# Patient Record
Sex: Male | Born: 1960 | Race: White | Hispanic: No | Marital: Single | Smoking: Former smoker
Health system: Southern US, Community
[De-identification: ages and names within clinical notes are randomized; demographics above are authoritative.]

## PROBLEM LIST (undated history)

## (undated) DIAGNOSIS — B1920 Unspecified viral hepatitis C without hepatic coma: Secondary | ICD-10-CM

---

## 2002-06-15 ENCOUNTER — Encounter: Payer: Self-pay | Admitting: *Deleted

## 2002-06-15 ENCOUNTER — Emergency Department (HOSPITAL_COMMUNITY): Admission: EM | Admit: 2002-06-15 | Discharge: 2002-06-15 | Payer: Self-pay | Admitting: *Deleted

## 2006-01-18 ENCOUNTER — Emergency Department: Payer: Self-pay | Admitting: Emergency Medicine

## 2006-01-19 ENCOUNTER — Emergency Department: Payer: Self-pay | Admitting: Emergency Medicine

## 2006-11-03 ENCOUNTER — Emergency Department: Payer: Self-pay | Admitting: Emergency Medicine

## 2009-01-28 ENCOUNTER — Observation Stay: Payer: Self-pay | Admitting: Internal Medicine

## 2009-01-30 ENCOUNTER — Inpatient Hospital Stay: Payer: Self-pay | Admitting: Psychiatry

## 2010-04-27 IMAGING — CT CT HEAD WITHOUT CONTRAST
1 series · 16 of 30 positions shown, 20 images · non-contrast
Comparison: none

REASON FOR EXAM: overdose altered mental status
COMMENTS:

PROCEDURE:     CT  - CT HEAD WITHOUT CONTRAST  - January 28, 2009  [DATE]
RESULT:     Comparison:  None
TECHNIQUE: Multiple axial images from the foramen magnum to the vertex were
obtained without IV contrast.

[Series 2: soft tissue · axial · 0.43mm/px · z∈[+880,+1040]mm · 16 of 36 slices shown, 20 images]
[im 2/36  brain]
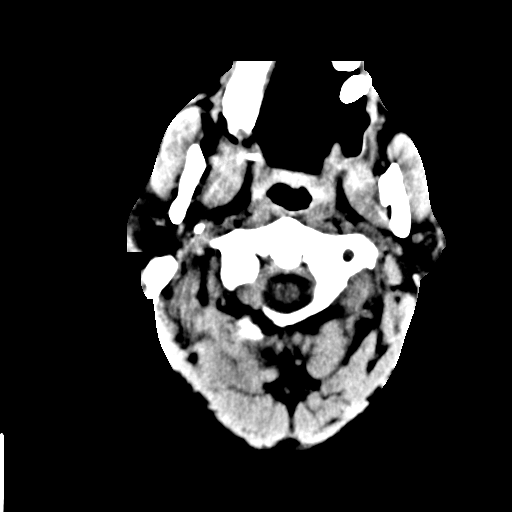
[im 2/36  bone]
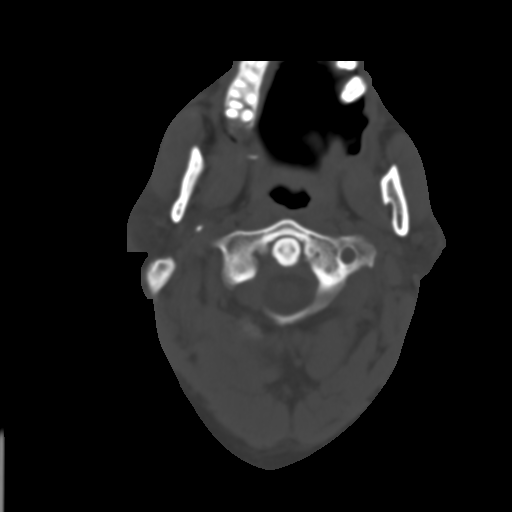
[im 4/36  brain]
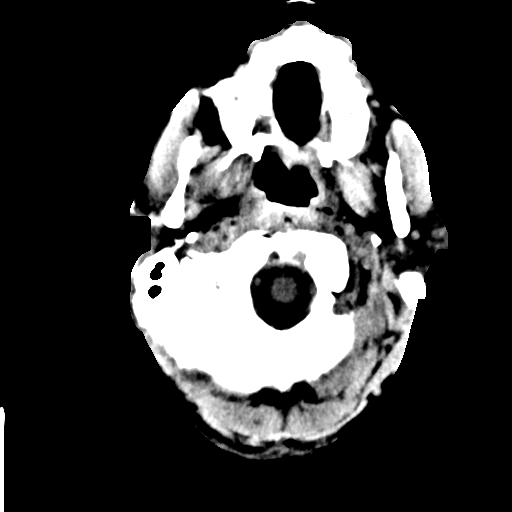
[im 7/36  brain]
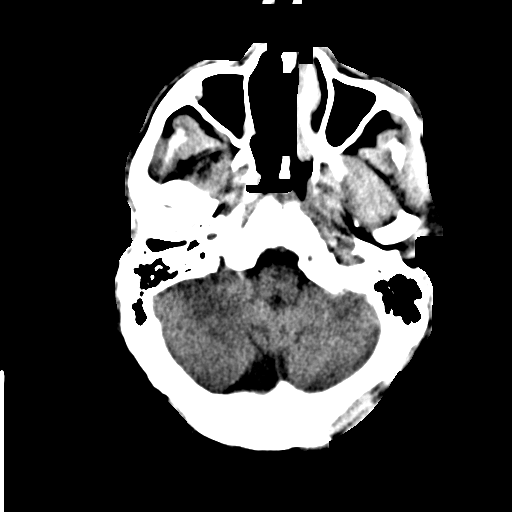
[im 9/36  brain]
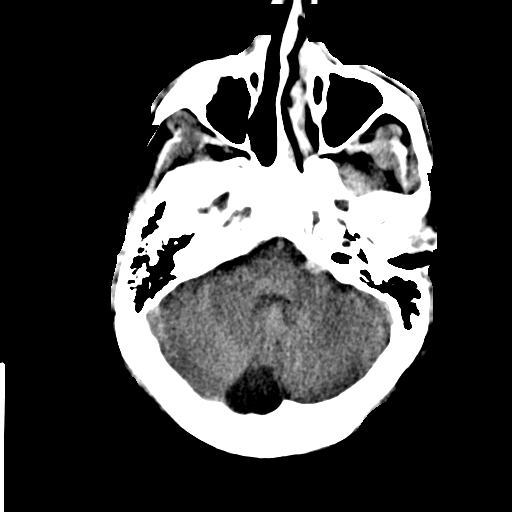
[im 10/36  brain]
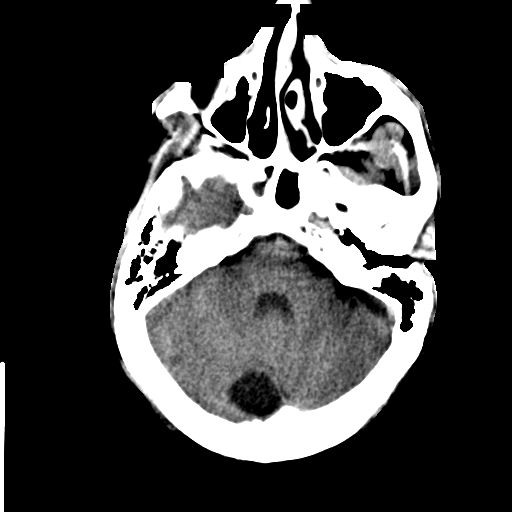
[im 10/36  bone]
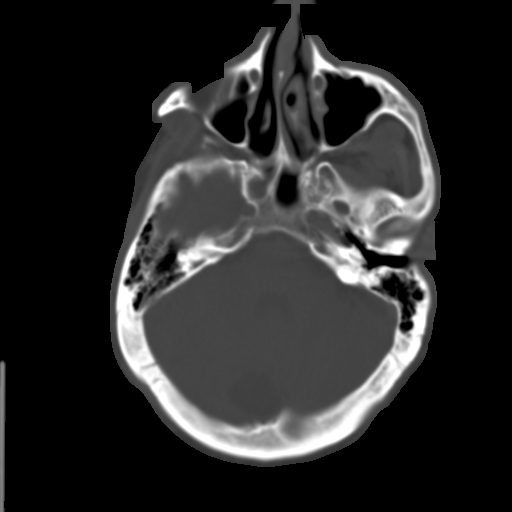
[im 13/36  brain]
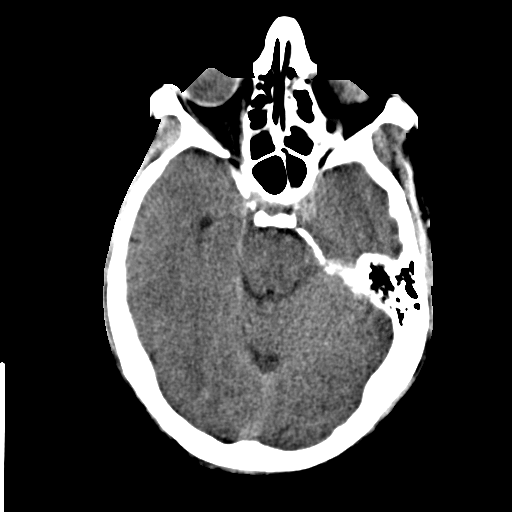
[im 15/36  brain]
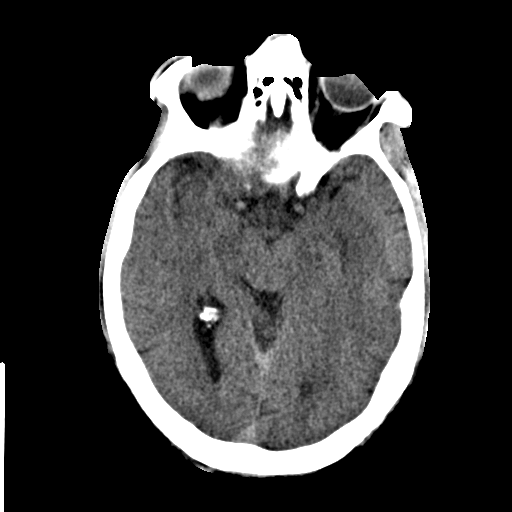
[im 17/36  brain]
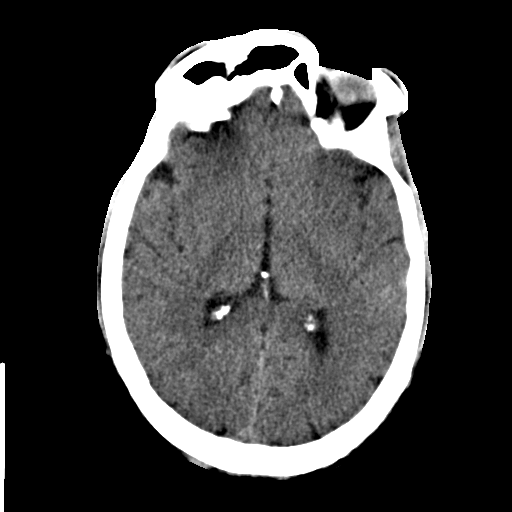
[im 19/36  brain]
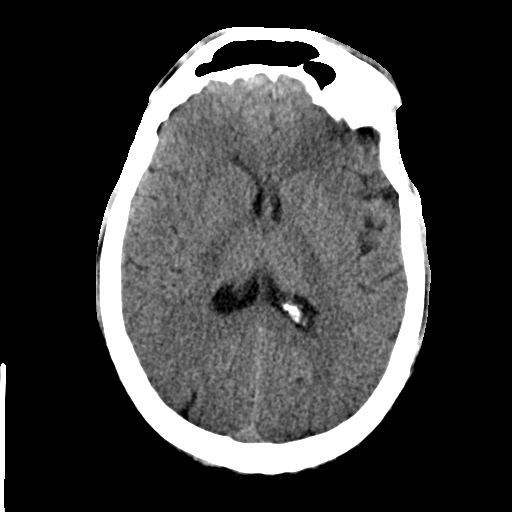
[im 19/36  bone]
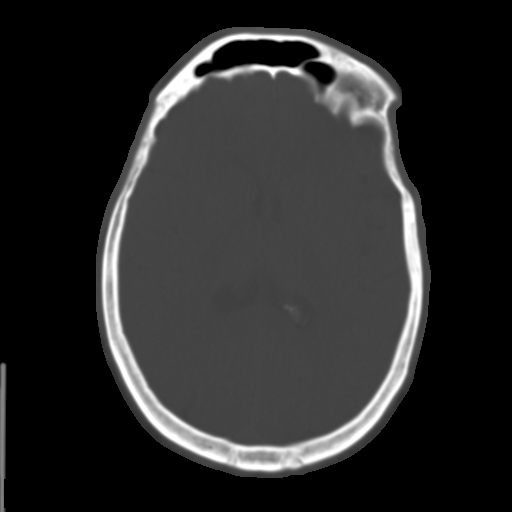
[im 21/36  brain]
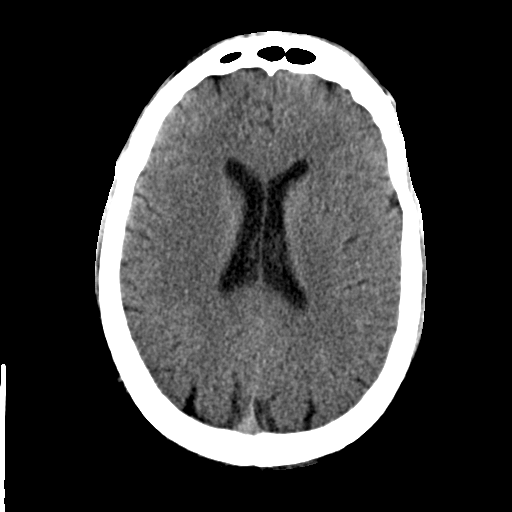
[im 23/36  brain]
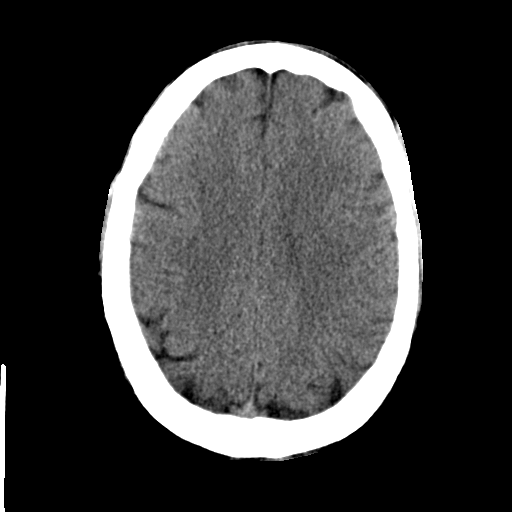
[im 26/36  brain]
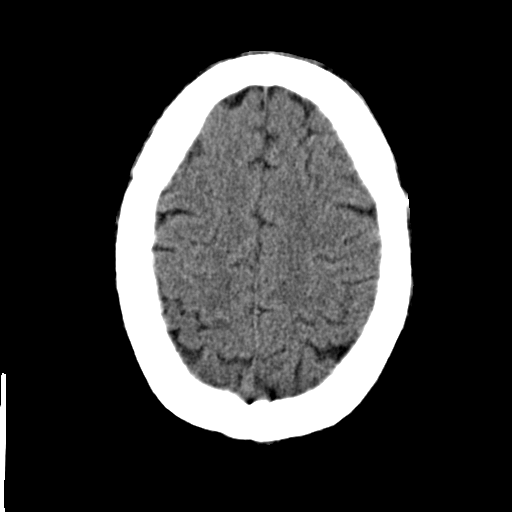
[im 27/36  brain]
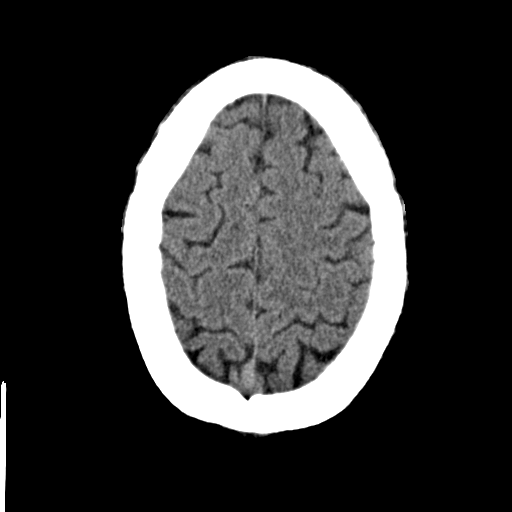
[im 27/36  bone]
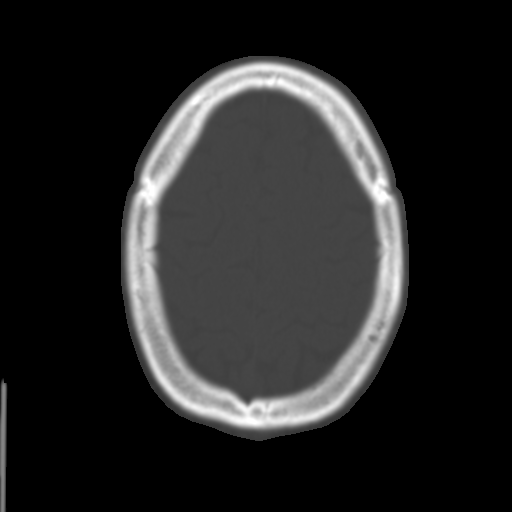
[im 29/36  brain]
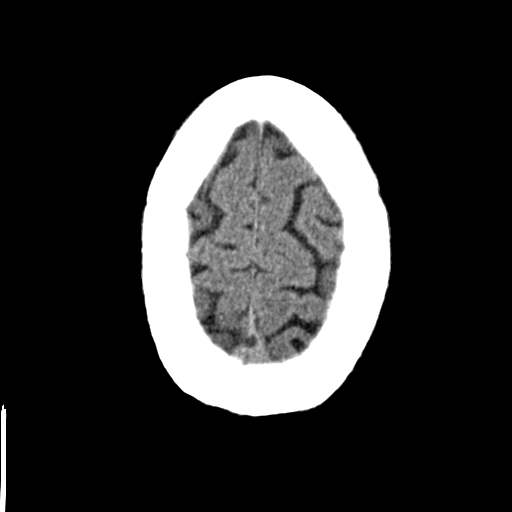
[im 32/36  brain]
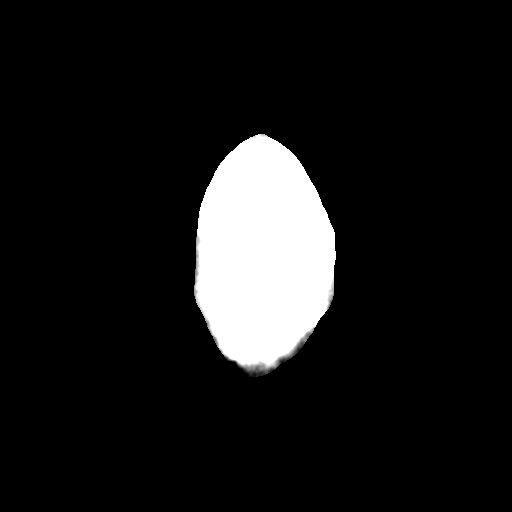
[im 34/36  brain]
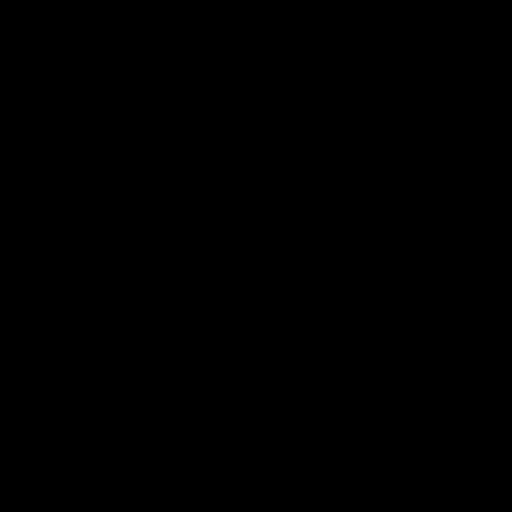

[16 of 30 positions shown; findings below may reference images not displayed]

FINDINGS: There is no evidence of mass effect, midline shift, or extra-axial fluid
collections.  There is no evidence of a space-occupying lesion or
intracranial hemorrhage. There is no evidence of a cortical-based area of
acute infarction.

The ventricles and sulci are appropriate for the patient's age. The basal
cisterns are patent.

Visualized portions of the orbits are unremarkable. There is mucosal
thickening involving the ethmoid sinuses an bilateral maxillary sinuses.

The osseous structures are unremarkable.
IMPRESSION: No acute intracranial process.

## 2014-04-11 ENCOUNTER — Emergency Department: Payer: Self-pay | Admitting: Student

## 2014-04-11 LAB — BASIC METABOLIC PANEL
Anion Gap: 3 — ABNORMAL LOW (ref 7–16)
BUN: 16 mg/dL (ref 7–18)
Calcium, Total: 8.3 mg/dL — ABNORMAL LOW (ref 8.5–10.1)
Chloride: 105 mmol/L (ref 98–107)
Co2: 31 mmol/L (ref 21–32)
Creatinine: 1.1 mg/dL (ref 0.60–1.30)
EGFR (African American): >60
EGFR (Non-African Amer.): >60
Glucose: 153 mg/dL — ABNORMAL HIGH (ref 65–99)
Osmolality: 282 (ref 275–301)
Potassium: 4 mmol/L (ref 3.5–5.1)
Sodium: 139 mmol/L (ref 136–145)

## 2014-04-11 LAB — CK: CK, Total: 234 U/L

## 2014-05-10 LAB — URINALYSIS, COMPLETE
BLOOD: NEGATIVE
Bacteria: NONE SEEN
Bilirubin,UR: NEGATIVE
Glucose,UR: 150 mg/dL (ref 0–75)
Ketone: NEGATIVE
Leukocyte Esterase: NEGATIVE
NITRITE: NEGATIVE
Ph: 6 (ref 4.5–8.0)
Protein: NEGATIVE
RBC,UR: <1 /HPF (ref 0–5)
SPECIFIC GRAVITY: 1.024 (ref 1.003–1.030)
Squamous Epithelial: <1
WBC UR: 2 /HPF (ref 0–5)

## 2014-05-10 LAB — SALICYLATE LEVEL: Salicylates, Serum: 2.5 mg/dL

## 2014-05-10 LAB — COMPREHENSIVE METABOLIC PANEL
Albumin: 3.7 g/dL (ref 3.4–5.0)
Alkaline Phosphatase: 116 U/L
Anion Gap: 9 (ref 7–16)
BUN: 10 mg/dL (ref 7–18)
Bilirubin,Total: 0.6 mg/dL (ref 0.2–1.0)
Calcium, Total: 8.2 mg/dL — ABNORMAL LOW (ref 8.5–10.1)
Chloride: 105 mmol/L (ref 98–107)
Co2: 26 mmol/L (ref 21–32)
Creatinine: 1.12 mg/dL (ref 0.60–1.30)
EGFR (African American): >60
EGFR (Non-African Amer.): >60
Glucose: 82 mg/dL (ref 65–99)
Osmolality: 278 (ref 275–301)
Potassium: 4.1 mmol/L (ref 3.5–5.1)
SGOT(AST): 62 U/L — ABNORMAL HIGH (ref 15–37)
SGPT (ALT): 121 U/L — ABNORMAL HIGH
Sodium: 140 mmol/L (ref 136–145)
Total Protein: 8.1 g/dL (ref 6.4–8.2)

## 2014-05-10 LAB — CBC
HCT: 51.3 % (ref 40.0–52.0)
HGB: 17.1 g/dL (ref 13.0–18.0)
MCH: 32 pg (ref 26.0–34.0)
MCHC: 33.4 g/dL (ref 32.0–36.0)
MCV: 96 fL (ref 80–100)
Platelet: 179 10*3/uL (ref 150–440)
RBC: 5.35 10*6/uL (ref 4.40–5.90)
RDW: 13.9 % (ref 11.5–14.5)
WBC: 9.7 10*3/uL (ref 3.8–10.6)

## 2014-05-10 LAB — DRUG SCREEN, URINE
Amphetamines, Ur Screen: NEGATIVE (ref ?–1000)
BARBITURATES, UR SCREEN: NEGATIVE (ref ?–200)
BENZODIAZEPINE, UR SCRN: NEGATIVE (ref ?–200)
COCAINE METABOLITE, UR ~~LOC~~: POSITIVE (ref ?–300)
Cannabinoid 50 Ng, Ur ~~LOC~~: NEGATIVE (ref ?–50)
MDMA (Ecstasy)Ur Screen: NEGATIVE (ref ?–500)
Methadone, Ur Screen: NEGATIVE (ref ?–300)
Opiate, Ur Screen: NEGATIVE (ref ?–300)
Phencyclidine (PCP) Ur S: NEGATIVE (ref ?–25)
TRICYCLIC, UR SCREEN: NEGATIVE (ref ?–1000)

## 2014-05-10 LAB — ETHANOL: Ethanol: 33 mg/dL

## 2014-05-10 LAB — ACETAMINOPHEN LEVEL: Acetaminophen: <2 ug/mL

## 2014-05-12 ENCOUNTER — Inpatient Hospital Stay: Payer: Self-pay | Admitting: Psychiatry
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
## 2014-10-18 NOTE — Consult Note (Signed)
PATIENT NAME:  Calvin CrockerURLINGTON, Calvin Page MR#:  [id number] DATE OF BIRTH:  [birth year]  DATE OF CONSULTATION:  05/11/2014  REFERRING PHYSICIAN:   CONSULTING PHYSICIAN:  Calvin Page K. Guss Bundehalla, MD  [facility]    SEX: Male  RACE: White  AGE: 54 years  SUBJECTIVE: The patient was seen in consultation in Page #[id number] of Calvin Page, Calvin Page, Calvin Page.  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 The patient is divorced and has been living with a girlfriend who is 52 years old and as he writes a journal and keeps up with a journal, the patient stated that his girlfriend found his journal and knew that he wrote that he wanted to kill himself, and this is a bad time of the year as at this time of the year he has lost his uncle, who was his best uncle, and calls him "[name]." In addition, he lost his mother and he lost his aunt and he lost his grandfather, and at this time of the year he feels very low and down and he wanted to kill himself and be with them and talk to them, and he wrote it in his journal, which made his girlfriend very anxious and she called here for help.   PAST PSYCHIATRIC HISTORY:  He had been  inpatient psychiatry on many occasions, to many hospitals.  In fact, he had inpatient stay at [facility] in 2009 when he was depressed.  He tried to kill himself by trying to hang himself, taking an overdose of pills and trying to jump off a car.  The patient reported that he was given a [facility]up [facility] and  stated that he could not see a psychiatrist for 1 year and he was upset and he does not want to go back to [facility].  ALCOHOL AND DRUGS: He admits that he does drinks alcohol at the rate of  2 of 40 oz beers per day at this time.  He has been to rehab on many occasions.   The longest we has been sober was  for 120 days, and he went home and started drinking alcohol the very next day.  He reports that he has abused all the drugs which include THC and also crack cocaine.  Last use of crack cocaine a few days ago.  Last used THC a few years ago.  Denies smoking nicotine cigarettes.  MENTAL STATUS: The patient is seen lying in bed, alert and oriented.  Calm and cooperative. Affect is flat.  Mood is depressed. Admits feeling hopeless and helpless.  At this time of the year is a very bad time of the year when he gets very low, down, depressed, thinking about deaths of all his close people with whom he been very close in the past and wanting to be with them, and having wishes of suicide and plans of suicide, but he contracted for safety while he is here.  Denies auditory or visual hallucinations.  Denies hearing voices.  Does have suicidal wishes.  Memory is intact.  Cognition is intact.  He knew the [place] of [place] and capital of the [place] and day and date. Insight and judgment guarded.  Impulse control is poor.  IMPRESSION: Major depressive disorder, recurrent with suicidal  idea with contract for safety.    RECOMMENDATIONS:  Inpatient hospital in psychiatry for close observation and management. He will be started back on  medications to help him rest and with his depression .   He will be followed by a floor physician for further help.  ____________________________ Jannet Mantis. Guss Bunde, MD skc:dw D: 05/11/2014 12:45:07 ET T: 05/11/2014 14:22:01 ET JOB#: [id number]  cc: Monika Salk K. Guss Bunde, MD, <Dictator> Calvin Fanny MD ELECTRONICALLY SIGNED 05/11/2014 15:08

## 2014-11-14 ENCOUNTER — Encounter: Payer: Self-pay | Admitting: Emergency Medicine

## 2014-11-14 ENCOUNTER — Emergency Department
Admission: EM | Admit: 2014-11-14 | Discharge: 2014-11-14 | Disposition: A | Discharge status: Home / Self Care | Payer: Self-pay | Attending: Emergency Medicine | Admitting: Emergency Medicine

## 2014-11-14 DIAGNOSIS — Z79899 Other long term (current) drug therapy: Secondary | ICD-10-CM | POA: Insufficient documentation

## 2014-11-14 DIAGNOSIS — Z87891 Personal history of nicotine dependence: Secondary | ICD-10-CM | POA: Insufficient documentation

## 2014-11-14 DIAGNOSIS — F32A Depression, unspecified: Secondary | ICD-10-CM

## 2014-11-14 DIAGNOSIS — F602 Antisocial personality disorder: Secondary | ICD-10-CM

## 2014-11-14 DIAGNOSIS — F1410 Cocaine abuse, uncomplicated: Secondary | ICD-10-CM | POA: Insufficient documentation

## 2014-11-14 DIAGNOSIS — F1490 Cocaine use, unspecified, uncomplicated: Secondary | ICD-10-CM

## 2014-11-14 DIAGNOSIS — F1420 Cocaine dependence, uncomplicated: Secondary | ICD-10-CM

## 2014-11-14 DIAGNOSIS — F3289 Other specified depressive episodes: Secondary | ICD-10-CM

## 2014-11-14 DIAGNOSIS — F329 Major depressive disorder, single episode, unspecified: Secondary | ICD-10-CM | POA: Insufficient documentation

## 2014-11-14 DIAGNOSIS — F1424 Cocaine dependence with cocaine-induced mood disorder: Secondary | ICD-10-CM

## 2014-11-14 DIAGNOSIS — F1210 Cannabis abuse, uncomplicated: Secondary | ICD-10-CM | POA: Insufficient documentation

## 2014-11-14 HISTORY — DX: Unspecified viral hepatitis C without hepatic coma: B19.20

## 2014-11-14 LAB — COMPREHENSIVE METABOLIC PANEL
ALT: 175 U/L — AB (ref 17–63)
AST: 115 U/L — AB (ref 15–41)
Albumin: 4.2 g/dL (ref 3.5–5.0)
Alkaline Phosphatase: 76 U/L (ref 38–126)
Anion gap: 10 (ref 5–15)
BUN: 10 mg/dL (ref 6–20)
CO2: 26 mmol/L (ref 22–32)
Calcium: 9.2 mg/dL (ref 8.9–10.3)
Chloride: 101 mmol/L (ref 101–111)
Creatinine, Ser: 1.01 mg/dL (ref 0.61–1.24)
GFR calc Af Amer: >60 mL/min (ref >60)
Glucose, Bld: 106 mg/dL — ABNORMAL HIGH (ref 65–99)
Potassium: 4.1 mmol/L (ref 3.5–5.1)
SODIUM: 137 mmol/L (ref 135–145)
TOTAL PROTEIN: 7.8 g/dL (ref 6.5–8.1)
Total Bilirubin: 0.9 mg/dL (ref 0.3–1.2)

## 2014-11-14 LAB — URINALYSIS COMPLETE WITH MICROSCOPIC (ARMC ONLY)
Bacteria, UA: NONE SEEN
Bilirubin Urine: NEGATIVE
Glucose, UA: 50 mg/dL — AB
Hgb urine dipstick: NEGATIVE
Ketones, ur: NEGATIVE mg/dL
LEUKOCYTES UA: NEGATIVE
NITRITE: NEGATIVE
PROTEIN: NEGATIVE mg/dL
Specific Gravity, Urine: 1.014 (ref 1.005–1.030)
Squamous Epithelial / LPF: NONE SEEN
pH: 5 (ref 5.0–8.0)

## 2014-11-14 LAB — CBC
HCT: 46.8 % (ref 40.0–52.0)
Hemoglobin: 15.8 g/dL (ref 13.0–18.0)
MCH: 31.4 pg (ref 26.0–34.0)
MCHC: 33.8 g/dL (ref 32.0–36.0)
MCV: 93.1 fL (ref 80.0–100.0)
Platelets: 165 10*3/uL (ref 150–440)
RBC: 5.02 MIL/uL (ref 4.40–5.90)
RDW: 14 % (ref 11.5–14.5)
WBC: 9 10*3/uL (ref 3.8–10.6)

## 2014-11-14 LAB — URINE DRUG SCREEN, QUALITATIVE (ARMC ONLY)
AMPHETAMINES, UR SCREEN: NOT DETECTED
BENZODIAZEPINE, UR SCRN: NOT DETECTED
Barbiturates, Ur Screen: NOT DETECTED
COCAINE METABOLITE, UR ~~LOC~~: POSITIVE — AB
Cannabinoid 50 Ng, Ur ~~LOC~~: POSITIVE — AB
MDMA (Ecstasy)Ur Screen: NOT DETECTED
Methadone Scn, Ur: NOT DETECTED
Opiate, Ur Screen: NOT DETECTED
PHENCYCLIDINE (PCP) UR S: NOT DETECTED
TRICYCLIC, UR SCREEN: NOT DETECTED

## 2014-11-14 LAB — SALICYLATE LEVEL

## 2014-11-14 LAB — ACETAMINOPHEN LEVEL: Acetaminophen (Tylenol), Serum: <10 ug/mL — ABNORMAL LOW (ref 10–30)

## 2014-11-14 LAB — ETHANOL: Alcohol, Ethyl (B): <5 mg/dL (ref <5)
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
## 2014-11-14 NOTE — ED Notes (Addendum)
Supper provided   Pt to be discharged to home   Pt given the phone so that he can call for transportation

## 2014-11-14 NOTE — [facility] Counselor (Signed)
Spoke with pt. for updated information to determine if he could be referred for SA Treatment. However, pt. is having passive SI. He states he isn't suicidal but have no motivation to live. He and his girlfriend are currently having problems and he is no longer living with her. He states, he is homeless but have family he is able to stay with but hasn't.

## 2014-11-14 NOTE — ED Notes (Signed)
Snack and drink provided. ?

## 2014-11-14 NOTE — ED Notes (Signed)
ED BHU PLACEMENT JUSTIFICATION Is the patient under IVC or is there intent for IVC: No. Is the patient medically cleared: Yes.   Is there vacancy in the ED BHU: Yes.   Is the population mix appropriate for patient: Yes.   Is the patient awaiting placement in inpatient or outpatient setting: Yes.   Has the patient had a psychiatric consult: No. Survey of unit performed for contraband, proper placement and condition of furniture, tampering with fixtures in bathroom, shower, and each patient room: Yes.  ; Findings:  APPEARANCE/BEHAVIOR calm, cooperative and adequate rapport can be established NEURO ASSESSMENT Orientation: time, place and person Hallucinations: noNone noted (Hallucinations) Speech: Normal Gait: normal RESPIRATORY ASSESSMENT Normal expansion.  Clear to auscultation.  No rales, rhonchi, or wheezing. CARDIOVASCULAR ASSESSMENT regular rate and rhythm, S1, S2 normal, no murmur, click, rub or gallop GASTROINTESTINAL ASSESSMENT soft, nontender, BS WNL, no r/g EXTREMITIES normal strength, tone, and muscle mass PLAN OF CARE Provide calm/safe environment. Vital signs assessed twice daily. ED BHU Assessment once each 12-hour shift. Collaborate with intake RN daily or as condition indicates. Assure the ED provider has rounded once each shift. Provide and encourage hygiene. Provide redirection as needed. Assess for escalating behavior; address immediately and inform ED provider.  Assess family dynamic and appropriateness for visitation as needed: yes; If necessary, describe findings:  Educate the patient/family about BHU procedures/visitation: Yes.  ; If necessary, describe findings:

## 2014-11-14 NOTE — ED Provider Notes (Signed)
[facility] Emergency Department Provider Note  ____________________________________________  Time seen: Approximately 4:29 AM  I have reviewed the triage vital signs and the nursing notes.   HISTORY  Chief Complaint Drug Problem    HPI Calvin Page is a 54 y.o. male who presents with police for detox from cocaine and depression. Patient states he has a long-standing history with depression, formally on venlafaxine. States "he is slowly killing himself and his family" due to his substance use. He is chronically depressed with occasional suicidal ideation. Currently does not have a plan to harm himself. Denies fever, chills, chest pain, shortness of breath, vomiting, diarrhea, headache. 4 days ago he removed an ingrown hair from his lower beard and squeeze some pus out.   Past medical history Depression Substance abuse Hepatitis C   There are no active problems to display for this patient.   History reviewed. No pertinent past surgical history.  Current Outpatient Rx  Name  Route  Sig  Dispense  Refill  . venlafaxine XR (EFFEXOR-XR) 150 MG 24 hr capsule   Oral   Take 150 mg by mouth daily with breakfast.           Allergies Review of patient's allergies indicates no known allergies.  History reviewed. No pertinent family history.  Social History History  Substance Use Topics  . Smoking status: Former Smoker -- 0.50 packs/day for 25 years    Types: Cigarettes    Quit date: 10/31/2014  . Smokeless tobacco: Not on file  . Alcohol Use: Yes     Comment: 1-2 drinks per week   smoker Cocaine use  Review of Systems Constitutional: No fever/chills Eyes: No visual changes. ENT: No sore throat. Cardiovascular: Denies chest pain. Respiratory: Denies shortness of breath. Gastrointestinal: No abdominal pain.  No nausea, no vomiting.  No diarrhea.  No constipation. Genitourinary: Negative for dysuria. Musculoskeletal: Negative for back  pain. Skin: Negative for rash. Neurological: Negative for headaches, focal weakness or numbness. Psychiatric:Positive for depression.  10-point ROS otherwise negative.  ____________________________________________   PHYSICAL EXAM:  VITAL SIGNS: ED Triage Vitals  Enc Vitals Group     BP 11/14/14 0410 153/106 mmHg     Pulse Rate 11/14/14 0410 81     Resp 11/14/14 0410 18     Temp 11/14/14 0410 98.4 F (36.9 C)     Temp Source 11/14/14 0410 Oral     SpO2 11/14/14 0410 98 %     Weight 11/14/14 0410 178 lb (80.74 kg)     Height 11/14/14 0410 5\' 11"  (1.803 m)     Head Cir --      Peak Flow --      Pain Score --      Pain Loc --      Pain Edu? --      Excl. in GC? --     Constitutional: Alert and oriented. Well appearing and in no acute distress. Eyes: Conjunctivae are normal. PERRL. EOMI. Head: Atraumatic. Nose: No congestion/rhinnorhea. Mouth/Throat: Mucous membranes are moist.  Oropharynx non-erythematous. Small scab noted to chin without induration or fluctuance. Neck: No stridor.   Cardiovascular: Normal rate, regular rhythm. Grossly normal heart sounds.  Good peripheral circulation. Respiratory: Normal respiratory effort.  No retractions. Lungs CTAB. Gastrointestinal: Soft and nontender. No distention. No abdominal bruits. No CVA tenderness. Musculoskeletal: No lower extremity tenderness nor edema.  No joint effusions. Neurologic:  Normal speech and language. No gross focal neurologic deficits are appreciated. Speech is normal. No  gait instability. Skin:  Skin is warm, dry and intact. No rash noted. Psychiatric: Mood and affect are depressed. Speech and behavior are normal.  ____________________________________________   LABS (all labs ordered are listed, but only abnormal results are displayed)  Labs Reviewed  COMPREHENSIVE METABOLIC PANEL - Abnormal; Notable for the following:    Glucose, Bld 106 (*)    AST 115 (*)    ALT 175 (*)    All other components  within normal limits  URINE DRUG SCREEN, QUALITATIVE ([facility]) - Abnormal; Notable for the following:    Cocaine Metabolite,Ur [place] POSITIVE (*)    Cannabinoid 50 Ng, Ur [place] POSITIVE (*)    All other components within normal limits  URINALYSIS COMPLETEWITH MICROSCOPIC ([facility])  - Abnormal; Notable for the following:    Color, Urine YELLOW (*)    APPearance CLEAR (*)    Glucose, UA 50 (*)    All other components within normal limits  ACETAMINOPHEN LEVEL - Abnormal; Notable for the following:    Acetaminophen (Tylenol), Serum <10 (*)    All other components within normal limits  CBC  ETHANOL  SALICYLATE LEVEL   ____________________________________________  EKG  ED ECG REPORT I, Jaileen Janelle J, the attending physician, personally viewed and interpreted this ECG.   Date: 11/14/2014  EKG Time: 0500  Rate: 66  Rhythm: normal EKG, normal sinus rhythm  Axis: Normal  Intervals:right bundle branch block  ST&T Change: Nonspecific  ____________________________________________  RADIOLOGY  None ____________________________________________   PROCEDURES  Procedure(s) performed: None  Critical Care performed: No  ____________________________________________   INITIAL IMPRESSION / ASSESSMENT AND PLAN / ED COURSE  Pertinent labs & imaging results that were available during my care of the patient were reviewed by me and considered in my medical decision making (see chart for details).  54 year old male who desires cocaine detox. Chronic depression with occasional thoughts of suicide. Denies SI currently. Patient will remain voluntary; behavioral medicine nurse to evaluate patient in the ED.  ----------------------------------------- 6:06 AM on 11/14/2014 -----------------------------------------  Patient was seen by behavioral med nurse. Will remain voluntary awaiting psychiatry consult this morning. ____________________________________________   FINAL CLINICAL IMPRESSION(S) / ED  DIAGNOSES  Final diagnoses:  Cocaine use  Depression      Irean HongJade J Yulia Ulrich, MD 11/14/14 [phone]

## 2014-11-14 NOTE — ED Notes (Signed)
Wants for cocaine detox, depressed, has some SI denies any HI.

## 2014-11-14 NOTE — ED Notes (Signed)
BEHAVIORAL HEALTH ROUNDING Patient sleeping: No. Patient alert and oriented: yes Behavior appropriate: Yes.   Nutrition and fluids offered: Yes  Toileting and hygiene offered: Yes  Sitter present: q15 min observations Law enforcement present: Yes [employer] 

## 2014-11-14 NOTE — ED Notes (Signed)

## 2014-11-14 NOTE — ED Notes (Signed)
Pt observed with no unusual behavior - lying in bed    Appropriate to stimulation  No verbalized needs or concerns at this time  NAD assessed  Continue to monitor 

## 2014-11-14 NOTE — ED Notes (Signed)

## 2014-11-14 NOTE — ED Notes (Signed)
BEH Intake Calvin Page with pt at this time.

## 2014-11-14 NOTE — Progress Notes (Signed)
Patient evaluated by psych MD with recommendation to follow up with outpatient resources.  CSW will provide patient information for [facility] and other outpatient resources.  Patient in agreement to following up with [facility] outpatient.  Calvin Page. Theresia MajorsLCSWA, MSW Clinical Social Work Department Emergency Room [phone] 3:50 PM

## 2014-11-14 NOTE — ED Notes (Signed)
Patient assigned to appropriate care area. Patient oriented to unit/care area: Informed that, for their safety, care areas are designed for safety and monitored by security cameras at all times; and visiting hours explained to patient. Patient verbalizes understanding, and verbal contract for safety obtained. 

## 2014-11-14 NOTE — ED Notes (Signed)
BEHAVIORAL HEALTH ROUNDING Patient sleeping: No. Patient alert and oriented: yes Behavior appropriate: Yes.  ; If no, describe:  Nutrition and fluids offered: yes Toileting and hygiene offered: Yes  Sitter present: q15 minute observations and security camera monitoring Law enforcement present: Yes  [employer]  

## 2014-11-14 NOTE — ED Provider Notes (Signed)
-----------------------------------------   4:00 PM on 11/14/2014 -----------------------------------------   BP 129/75 mmHg  Pulse 70  Temp(Src) 97.6 F (36.4 C) (Oral)  Resp 18  Ht 5\' 11"  (1.803 m)  Wt 178 lb (80.74 kg)  BMI 24.84 kg/m2  SpO2 98%  Patient is calm and cooperative at this time. Denies all suicidal and homicidal ideation. Has been seen and cleared by psychiatrist, Dr. Toni Amendlapacs. Will follow up with [facility]. Patient is clinically sober. Will be discharged home.     Myrna Blazeravid Matthew Schaevitz, MD 11/14/14 [phone]

## 2014-11-14 NOTE — ED Notes (Signed)
Pt has been lying in bed - he is currently talking on the phone after receiving a call   NAD pbserved  No verbalized needs or concerns at this time

## 2014-11-14 NOTE — ED Notes (Signed)
1/1 bags of belongings returned to the pt   Discharge instructions reviewed with the pt and he verbalized agreement and understanding

## 2014-11-14 NOTE — ED Notes (Signed)
Pt transferred into ED BHU 3 [facility]  Patient assigned to appropriate care area. Patient oriented to unit/care area: Informed that, for their safety, care areas are designed for safety and monitored by security cameras at all times; Visiting hours and phone times explained to patient. Patient verbalizes understanding, and verbal contract for safety obtained.   Psych consult pending

## 2014-11-14 NOTE — Consult Note (Signed)
[facility] Consult   Reason for Consult:  This is a 54 year old man with a history of cocaine abuse and substance-induced mood disorder. 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 Cocaine dependence [F14.20] 11/14/2014  . Antisocial personality disorder [F60.2] 11/14/2014    Total Time spent with patient: 1 hour  Subjective:   Calvin Page is a 54 y.o. male patient admitted with "I'm just tired". Says he has a lot of stress in his life and he wants to make some changes.  HPI:  Information obtained from the patient and the chart. Patient came in to the emergency room saying that he was "tired". He says he is tired of his lifestyle. He's using cocaine about a gram a day. Says that he drinks a little bit but not a lot denies other drugs. Mood is been feeling anxious and irritable recently. Feels like he has to much stress on him. Patient is not a very good historian and refuses to answer many questions that I asked. He did state that he was not having any suicidal ideation or homicidal ideation currently. Physically he did not present any specific new complaints. He is not living any place in particular and says he is frustrated with his relationship with his girlfriend. Not currently working. Doesn't feel like he knows what to do to improve his life. Not currently compliant with any outpatient psychiatric treatment although he claims he is still taking Effexor which was prescribed 6 months ago. past psychiatric history multiple presentations for cocaine abuse and depression. Has been prescribed antidepressants in the  past most recently Effexor. He says that antidepressants don't seem to ever been of help to him. He claims he is trying to kill himself multiple times over the years he's a little bit vague in the details to me. In the past he has cited a few attempts previously. He says right now he hasn't been thinking about it for weeks.  Social history is that he is homeless. Not working. Minimal resources.  Medical history is that the patient denies any acute medical symptoms is not on any prescription medicines for any chronic medical problems  Family history: Patient declines to answer questions about HPI Elements:   Quality:  Irritability and frustration. Severity:  Moderate. Timing:  Long-standing and chronic. Duration:  Present for years. Context:  Ongoing cocaine abuse and lack of self-care.  Past Medical History:  Past Medical History  Diagnosis Date  . Hepatitis C    History reviewed. No pertinent past surgical history. Family History: History reviewed. No pertinent family history. Social History:  History  Alcohol Use  . Yes    Comment: 1-2 drinks per week     History  Drug Use  . Yes  . Special: Cocaine    History   Social History  . Marital Status: Single    Spouse Name: N/A  . Number of Children: N/A  . Years of Education: N/A   Social History Main Topics  . Smoking status: Former Smoker -- 0.50 packs/day for 25 years    Types: Cigarettes    Quit date: 10/31/2014  . Smokeless tobacco: Not on file  . Alcohol Use: Yes  Comment: 1-2 drinks per week  . Drug Use: Yes    Special: Cocaine  . Sexual Activity: Not on file   Other Topics Concern  . None   Social History Narrative  . None   Additional Social History:                          Allergies:  No Known Allergies  Labs:  Results for orders placed or performed during the hospital encounter of 11/14/14 (from the past 48 hour(s))  CBC     Status: None   Collection Time: 11/14/14  4:22 AM  Result  Value Ref Range   WBC 9.0 3.8 - 10.6 K/uL   RBC 5.02 4.40 - 5.90 MIL/uL   Hemoglobin 15.8 13.0 - 18.0 g/dL   HCT 46.8 40.0 - 52.0 %   MCV 93.1 80.0 - 100.0 fL   MCH 31.4 26.0 - 34.0 pg   MCHC 33.8 32.0 - 36.0 g/dL   RDW 14.0 11.5 - 14.5 %   Platelets 165 150 - 440 K/uL  Comprehensive metabolic panel     Status: Abnormal   Collection Time: 11/14/14  4:22 AM  Result Value Ref Range   Sodium 137 135 - 145 mmol/L   Potassium 4.1 3.5 - 5.1 mmol/L   Chloride 101 101 - 111 mmol/L   CO2 26 22 - 32 mmol/L   Glucose, Bld 106 (H) 65 - 99 mg/dL   BUN 10 6 - 20 mg/dL   Creatinine, Ser 1.01 0.61 - 1.24 mg/dL   Calcium 9.2 8.9 - 10.3 mg/dL   Total Protein 7.8 6.5 - 8.1 g/dL   Albumin 4.2 3.5 - 5.0 g/dL   AST 115 (H) 15 - 41 U/L   ALT 175 (H) 17 - 63 U/L   Alkaline Phosphatase 76 38 - 126 U/L   Total Bilirubin 0.9 0.3 - 1.2 mg/dL   GFR calc non Af Amer >60 >60 mL/min   GFR calc Af Amer >60 >60 mL/min    Comment: (NOTE) The eGFR has been calculated using the CKD EPI equation. This calculation has not been validated in all clinical situations. eGFR's persistently <60 mL/min signify possible Chronic Kidney Disease.    Anion gap 10 5 - 15  Ethanol     Status: None   Collection Time: 11/14/14  4:22 AM  Result Value Ref Range   Alcohol, Ethyl (B) <5 <5 mg/dL    Comment:        LOWEST DETECTABLE LIMIT FOR SERUM ALCOHOL IS 11 mg/dL FOR MEDICAL PURPOSES ONLY   Urine Drug Screen, Qualitative [facility])     Status: Abnormal   Collection Time: 11/14/14  4:22 AM  Result Value Ref Range   Tricyclic, Ur Screen NONE DETECTED NONE DETECTED   Amphetamines, Ur Screen NONE DETECTED NONE DETECTED   MDMA (Ecstasy)Ur Screen NONE DETECTED NONE DETECTED   Cocaine Metabolite,Ur [place] POSITIVE (A) NONE DETECTED   Opiate, Ur Screen NONE DETECTED NONE DETECTED   Phencyclidine (PCP) Ur S NONE DETECTED NONE DETECTED   Cannabinoid 50 Ng, Ur  POSITIVE (A) NONE DETECTED   Barbiturates, Ur Screen NONE DETECTED NONE  DETECTED   Benzodiazepine, Ur Scrn NONE DETECTED NONE DETECTED   Methadone Scn, Ur NONE DETECTED NONE DETECTED    Comment: (NOTE) 536  Tricyclics, urine               Cutoff 1000 ng/mL 200  Amphetamines, urine  Cutoff 1000 ng/mL 300  MDMA (Ecstasy), urine           Cutoff 500 ng/mL 400  Cocaine Metabolite, urine       Cutoff 300 ng/mL 500  Opiate, urine                   Cutoff 300 ng/mL 600  Phencyclidine (PCP), urine      Cutoff 25 ng/mL 700  Cannabinoid, urine              Cutoff 50 ng/mL 800  Barbiturates, urine             Cutoff 200 ng/mL 900  Benzodiazepine, urine           Cutoff 200 ng/mL 1000 Methadone, urine                Cutoff 300 ng/mL 1100 1200 The urine drug screen provides only a preliminary, unconfirmed 1300 analytical test result and should not be used for non-medical 1400 purposes. Clinical consideration and professional judgment should 1500 be applied to any positive drug screen result due to possible 1600 interfering substances. A more specific alternate chemical method 1700 must be used in order to obtain a confirmed analytical result.  1800 Gas chromato graphy / mass spectrometry (GC/MS) is the preferred 1900 confirmatory method.   Urinalysis complete, with microscopic [facility])     Status: Abnormal   Collection Time: 11/14/14  4:22 AM  Result Value Ref Range   Color, Urine YELLOW (A) YELLOW   APPearance CLEAR (A) CLEAR   Glucose, UA 50 (A) NEGATIVE mg/dL   Bilirubin Urine NEGATIVE NEGATIVE   Ketones, ur NEGATIVE NEGATIVE mg/dL   Specific Gravity, Urine 1.014 1.005 - 1.030   Hgb urine dipstick NEGATIVE NEGATIVE   pH 5.0 5.0 - 8.0   Protein, ur NEGATIVE NEGATIVE mg/dL   Nitrite NEGATIVE NEGATIVE   Leukocytes, UA NEGATIVE NEGATIVE   RBC / HPF 0-5 0 - 5 RBC/hpf   WBC, UA 0-5 0 - 5 WBC/hpf   Bacteria, UA NONE SEEN NONE SEEN   Squamous Epithelial / LPF NONE SEEN NONE SEEN   Mucous PRESENT   Acetaminophen level     Status: Abnormal   Collection  Time: 11/14/14  4:22 AM  Result Value Ref Range   Acetaminophen (Tylenol), Serum <10 (L) 10 - 30 ug/mL    Comment:        THERAPEUTIC CONCENTRATIONS VARY SIGNIFICANTLY. A RANGE OF 10-30 ug/mL MAY BE AN EFFECTIVE CONCENTRATION FOR MANY PATIENTS. HOWEVER, SOME ARE BEST TREATED AT CONCENTRATIONS OUTSIDE THIS RANGE. ACETAMINOPHEN CONCENTRATIONS >150 ug/mL AT 4 HOURS AFTER INGESTION AND >50 ug/mL AT 12 HOURS AFTER INGESTION ARE OFTEN ASSOCIATED WITH TOXIC REACTIONS.   Salicylate level     Status: None   Collection Time: 11/14/14  4:22 AM  Result Value Ref Range   Salicylate Lvl <9.1 2.8 - 30.0 mg/dL    Vitals: Blood pressure 129/75, pulse 70, temperature 97.6 F (36.4 C), temperature source Oral, resp. rate 18, height 5' 11"  (1.803 m), weight 80.74 kg (178 lb), SpO2 98 %.  Risk to Self: Suicidal Ideation: No Suicidal Intent: No Is patient at risk for suicide?: No ("I have been trying to kill myself since I was 54 y.o.; I ai) Suicidal Plan?: No Access to Means: Yes ("I wanted the drugs to kill me; I was hoping they would.") Specify Access to Suicidal Means:  (substance use) What has been your use of drugs/alcohol within the last 12  months?: cocaine use since age 84; daily use; and occassional alcohol use; 24 oz a day How many times?: 0 Other Self Harm Risks: substance use Triggers for Past Attempts: None known Intentional Self Injurious Behavior: None Risk to Others: Homicidal Ideation: No Thoughts of Harm to Others: No Current Homicidal Intent: No Current Homicidal Plan: No Access to Homicidal Means: No Identified Victim: none History of harm to others?: No Assessment of Violence: On admission Violent Behavior Description: none Does patient have access to weapons?: No Criminal Charges Pending?: No Does patient have a court date: No Prior Inpatient Therapy: Prior Inpatient Therapy: Yes Prior Therapy Dates: [facility] 2 months ago Prior Therapy Facilty/Provider(s):  [facility] Reason for Treatment: rehab Prior Outpatient Therapy: Prior Outpatient Therapy: No Prior Therapy Dates: none Prior Therapy Facilty/Provider(s): none Reason for Treatment: rehab Does patient have an ACCT team?: No Does patient have Intensive In-House Services?  : No Does patient have [facility] services? : No Does patient have [facility] services?: No  No current facility-administered medications for this encounter.   Current Outpatient Prescriptions  Medication Sig Dispense Refill  . venlafaxine XR (EFFEXOR-XR) 150 MG 24 hr capsule Take 150 mg by mouth daily with breakfast.      Musculoskeletal: Strength & Muscle Tone: within normal limits Gait & Station: normal Patient leans: N/A  Psychiatric Specialty Exam: Physical Exam  Constitutional: He appears well-developed and well-nourished.  HENT:  Head: Normocephalic and atraumatic.  Eyes: Conjunctivae are normal. Pupils are equal, round, and reactive to light.  Neck: Normal range of motion.  Cardiovascular: Normal heart sounds.   Respiratory: Effort normal.  GI: Soft.  Musculoskeletal: Normal range of motion.  Neurological: He is alert.  Skin: Skin is warm and dry.  Psychiatric: His speech is normal. Thought content normal. His affect is angry. He is agitated. Cognition and memory are normal. He expresses impulsivity. He exhibits a depressed mood.    Review of Systems  Constitutional: Negative.   HENT: Negative.   Eyes: Negative.   Respiratory: Negative.   Cardiovascular: Negative.   Gastrointestinal: Negative.   Musculoskeletal: Negative.   Skin: Negative.   Neurological: Negative.   Psychiatric/Behavioral: Positive for depression and substance abuse. Negative for suicidal ideas and hallucinations. The patient is nervous/anxious and has insomnia.     Blood pressure 129/75, pulse 70, temperature 97.6 F (36.4 C), temperature source Oral, resp. rate 18, height 5' 11"  (1.803 m), weight 80.74 kg (178 lb), SpO2 98  %.Body mass index is 24.84 kg/(m^2).  General Appearance: Disheveled  Eye Contact::  Minimal  Speech:  Normal Rate  Volume:  Normal  Mood:  Irritable  Affect:  Labile  Thought Process:  Goal Directed and Logical  Orientation:  Full (Time, Place, and Person)  Thought Content:  Negative  Suicidal Thoughts:  No  Homicidal Thoughts:  No  Memory:  Immediate;   Good Recent;   Refuses testing Remote;   Refuses testing  Judgement:  Poor  Insight:  Lacking  Psychomotor Activity:  Normal  Concentration:  Fair  Recall:  [employer] of Knowledge:Fair  Language: Fair  Akathisia:  Negative  Handed:  Right  AIMS (if indicated):     Assets:  Communication Skills Desire for Improvement Physical Health  ADL's:  Intact  Cognition: WNL  Sleep:      Medical Decision Making: Established Problem, Stable/Improving (1), Review or order clinical lab tests (1), Discuss test with performing physician (1), Decision to obtain old records (1), Review and summation of old  records (2) and Review of Medication Regimen & Side Effects (2)  Treatment Plan Summary: Plan Patient does not meet criteria for admission to psychiatry ward. He is currently irritable and demanding. Not psychotic. Not voicing suicidal or homicidal ideation. Medically stable. Patient has been advised that he should follow-up with outpatient substance abuse treatment locally at [facility] and continue his current treatment plan. No new medications required. Reviewed plan with patient. Patient can be taken off involuntary commitment if one exists otherwise he can simply be discharged I will discuss the case with the emergency room physician.  Plan:  No evidence of imminent risk to self or others at present.   Patient does not meet criteria for psychiatric inpatient admission. Supportive therapy provided about ongoing stressors. Discussed crisis plan, support from social network, calling 911, coming to the Emergency Department, and calling Suicide  Hotline. Disposition: Recommend discharge from the emergency room follow-up with local mental health and substance abuse clinic  Weber Cooks [facility] 11/14/2014 3:17 PM

## 2014-11-14 NOTE — ED Notes (Signed)
BEHAVIORAL HEALTH ROUNDING Patient sleeping: Yes.   Patient alert and oriented: not applicable Behavior appropriate: Yes.    Nutrition and fluids offered: No Toileting and hygiene offered: No Sitter present: q15 minute observations Law enforcement present: Yes [employer] 

## 2014-11-14 NOTE — ED Notes (Signed)
BEHAVIORAL HEALTH ROUNDING Patient sleeping: Yes.   Patient alert and oriented: yes Behavior appropriate: Yes.  ; If no, describe:  Nutrition and fluids offered: Yes  Toileting and hygiene offered: Yes  Sitter present: no Law enforcement present: Yes  

## 2014-11-14 NOTE — ED Notes (Signed)
VOL/ Consult completed/pending dispoition

## 2015-02-26 DEATH — deceased

## 2015-07-09 IMAGING — CR DG CHEST 1V PORT
1 series · 1 of 1 positions shown · non-contrast
Comparison: None.

CLINICAL DATA: Confusion, memory loss

EXAM:
PORTABLE CHEST - 1 VIEW

[ap]
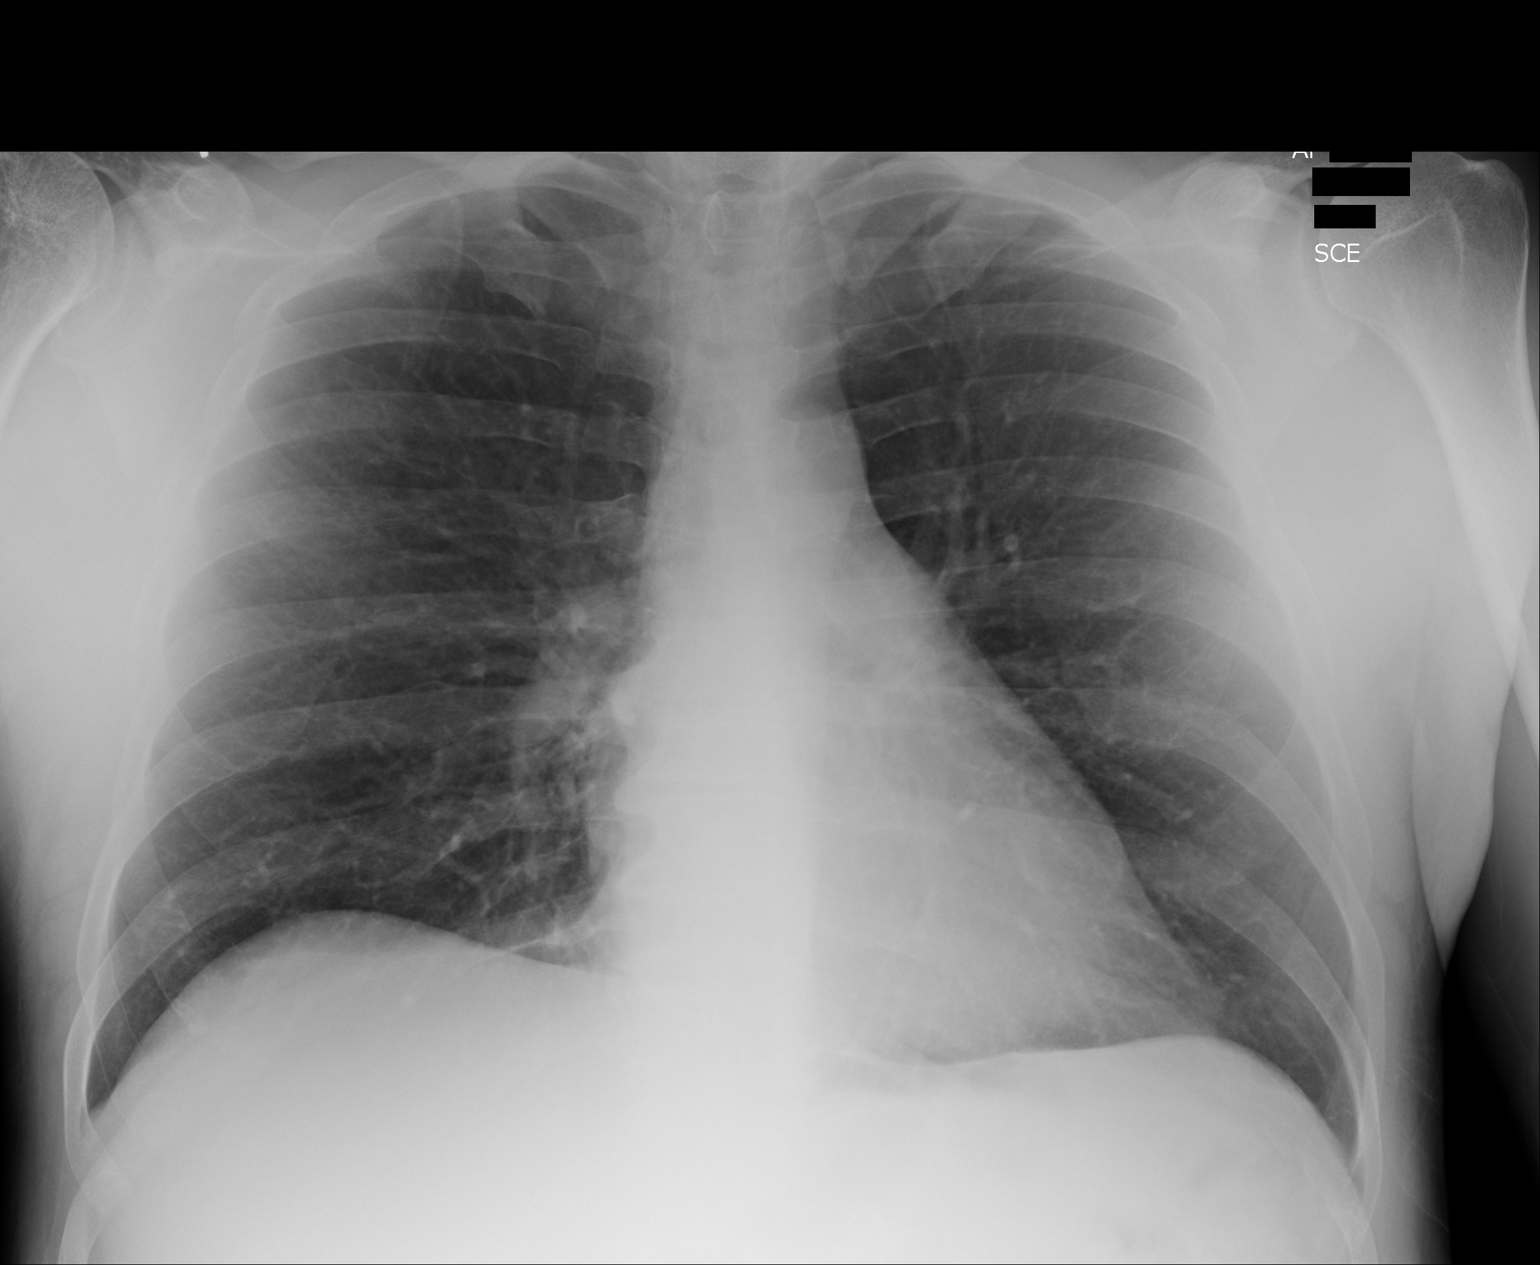

[1 of 1 positions shown; findings below may reference images not displayed]

FINDINGS: No active infiltrate or effusion is seen. Mediastinal and hilar
contours appear normal. The heart is within normal limits in size.
No bony abnormality is seen.
IMPRESSION: No active disease.
